# Patient Record
Sex: Female | Born: 1995 | Race: Black or African American | Hispanic: No | Marital: Single | Smoking: Never smoker
Health system: Southern US, Community
[De-identification: ages and names within clinical notes are randomized; demographics above are authoritative.]

---

## 2018-01-08 ENCOUNTER — Ambulatory Visit (HOSPITAL_COMMUNITY)
Admission: EM | Admit: 2018-01-08 | Discharge: 2018-01-08 | Discharge status: Against Medical Advice | Payer: Medicaid Other

## 2018-01-08 NOTE — ED Notes (Signed)
Per registration, pt stated she needed to leave and will return later.

## 2018-03-30 ENCOUNTER — Ambulatory Visit (HOSPITAL_COMMUNITY)
Admission: EM | Admit: 2018-03-30 | Discharge: 2018-03-30 | Disposition: A | Discharge status: Home / Self Care | Payer: Medicaid Other | Attending: Family Medicine | Admitting: Family Medicine

## 2018-03-30 ENCOUNTER — Encounter (HOSPITAL_COMMUNITY): Payer: Self-pay | Admitting: Emergency Medicine

## 2018-03-30 DIAGNOSIS — J028 Acute pharyngitis due to other specified organisms: Secondary | ICD-10-CM

## 2018-03-30 DIAGNOSIS — J029 Acute pharyngitis, unspecified: Secondary | ICD-10-CM | POA: Insufficient documentation

## 2018-03-30 DIAGNOSIS — J039 Acute tonsillitis, unspecified: Secondary | ICD-10-CM

## 2018-03-30 LAB — POCT RAPID STREP A: Streptococcus, Group A Screen (Direct): NEGATIVE

## 2018-03-30 MED ORDER — AMOXICILLIN 500 MG PO CAPS: 500.0000 mg | ORAL_CAPSULE | Freq: Two times a day (BID) | ORAL | 0 refills | Status: AC

## 2018-03-30 NOTE — ED Triage Notes (Signed)
Pt sts sore throat x 1 week 

## 2018-03-30 NOTE — ED Provider Notes (Addendum)
[facility]    CSN: [id number] Arrival date & time: 03/30/18  1232     History   Chief Complaint Chief Complaint  Patient presents with  . Sore Throat    HPI Werner LeanJazmon Gama is a 22 y.o. female.complains of sore throat for the past week.  She denies any sick contacts. She states the pain is constant and burning. She has tried aleve with improvement.  She reports worsening of symptoms with swallowing.  She has not had similar symptoms in the past.  Patient states she has had strep throat in the past, but this does not feel the same.  She also complains of rhinorrhea.   She denies fever, chills, fatigue, ear pain, congestion, sinus pain, sinus pressure, cough, chest congestion, SOB, chest pain, abdominal pain, nausea, changes in bowel or bladder habits.    HPI  History reviewed. No pertinent past medical history.  There are no active problems to display for this patient.   History reviewed. No pertinent surgical history.  OB History   None      Home Medications    Prior to Admission medications   Not on File    Family History History reviewed. No pertinent family history.  Social History Social History   Tobacco Use  . Smoking status: Never Smoker  . Smokeless tobacco: Never Used  Substance Use Topics  . Alcohol use: Never    Frequency: Never  . Drug use: Never     Allergies   Patient has no known allergies.   Review of Systems Review of Systems  Constitutional: Negative for chills, fatigue and fever.  HENT: Positive for rhinorrhea and sore throat. Negative for congestion, ear discharge, ear pain, sinus pressure and sinus pain.   Respiratory: Negative for cough and shortness of breath.   Cardiovascular: Negative for chest pain.  Gastrointestinal: Negative.   Genitourinary: Negative.     See HPI Physical Exam Triage Vital Signs ED Triage Vitals [03/30/18 1250]  Enc Vitals Group     BP (!) 140/99     Pulse Rate (!) 113     Resp 18   Temp 98.7 F (37.1 C)     Temp Source Oral     SpO2 98 %     Weight      Height      Head Circumference      Peak Flow      Pain Score      Pain Loc      Pain Edu?      Excl. in GC?    No data found.  Updated Vital Signs BP (!) 140/99 (BP Location: Left Arm)   Pulse (!) 113   Temp 98.7 F (37.1 C) (Oral)   Resp 18   SpO2 98%      Pulse rechecked during examination and was 80 bpm  Physical Exam  Constitutional: She is oriented to person, place, and time. She appears well-developed and well-nourished.  HENT:  Head: Normocephalic and atraumatic.  Right Ear: Tympanic membrane and ear canal normal. No tenderness. No middle ear effusion.  Left Ear: Tympanic membrane and ear canal normal. No tenderness.  No middle ear effusion.  Mouth/Throat: Uvula is midline. Mucous membranes are not pale and not dry. No oral lesions. No uvula swelling. Oropharyngeal exudate, posterior oropharyngeal edema and posterior oropharyngeal erythema present. No tonsillar abscesses. Tonsils are 2+ on the right. Tonsils are 2+ on the left. Tonsillar exudate.  Eyes: Pupils are equal, round, and reactive to  light. EOM are normal.  Anterior and posterior cervical lymphadenopathy  Neck: Normal range of motion. Neck supple.  Anterior and posterior cervical lymphadenopathy  Cardiovascular: Normal rate, regular rhythm and normal heart sounds. Exam reveals no gallop.  No murmur heard. Pulmonary/Chest: Effort normal and breath sounds normal. No respiratory distress. She has no wheezes. She has no rhonchi. She has no rales.  Lymphadenopathy:    She has cervical adenopathy.  Neurological: She is alert and oriented to person, place, and time.  Skin: Skin is warm and dry.  Psychiatric: Her behavior is normal.  Tearful in beginning of exam due to financial concerns     UC Treatments / Results  Labs (all labs ordered are listed, but only abnormal results are displayed) Labs Reviewed  CULTURE, GROUP A STREP  [facility])  POCT RAPID STREP A    EKG None Radiology No results found.  Procedures Procedures (including critical care time)  Medications Ordered in UC Medications - No data to display   Initial Impression / Assessment and Plan / UC Course  I have reviewed the triage vital signs and the nursing notes.  Pertinent labs & imaging results that were available during my care of the patient were reviewed by me and considered in my medical decision making (see chart for details).  Clinical Course as of Apr 07 2122  Wed Mar 30, 2018  1340 Rechecked pulse: 80 bpm and regular during exam  Pulse Rate(!): 113 [BW]    Clinical Course User Index [BW] Saphronia Ozdemir, [place], PA-C    Rapid strep negative, culture sent. Symptoms and exam findings most consistent with strep throat.  Start antibiotic as directed. Symptomatic treatment as needed. Return precautions given.    Final Clinical Impressions(s) / UC Diagnoses   Final diagnoses:  None    ED Discharge Orders    None       Controlled Substance Prescriptions [place] Controlled Substance Registry consulted? Not Applicable   Rennis Harding, PA-C 03/30/18 1335    Alvino Chapel [place], [place] 04/07/18 2124

## 2018-03-30 NOTE — Discharge Instructions (Addendum)
Get plenty of rest and push fluids Take antibiotic as directed and to completion Use OTC medication as needed for symptomatic relief Follow up with PCP if symptoms persist Return or go to ER if you have any new or worsening symptoms

## 2018-04-01 LAB — CULTURE, GROUP A STREP (THRC)

## 2018-04-02 ENCOUNTER — Encounter (HOSPITAL_COMMUNITY): Payer: Self-pay | Admitting: Emergency Medicine

## 2018-04-02 ENCOUNTER — Ambulatory Visit (HOSPITAL_COMMUNITY)
Admission: EM | Admit: 2018-04-02 | Discharge: 2018-04-02 | Disposition: A | Discharge status: Home / Self Care | Payer: Self-pay | Attending: Family Medicine | Admitting: Family Medicine

## 2018-04-02 DIAGNOSIS — B2790 Infectious mononucleosis, unspecified without complication: Secondary | ICD-10-CM

## 2018-04-02 LAB — POCT INFECTIOUS MONO SCREEN: MONO SCREEN: POSITIVE — AB

## 2018-04-02 MED ORDER — METHYLPREDNISOLONE SODIUM SUCC 125 MG IJ SOLR
125.0000 mg | Freq: Once | INTRAMUSCULAR | Status: AC
  Administered 2018-04-02: 125 mg via INTRAMUSCULAR

## 2018-04-02 MED ORDER — METHYLPREDNISOLONE SODIUM SUCC 125 MG IJ SOLR
INTRAMUSCULAR | Status: AC
  Filled 2018-04-02: qty 2

## 2018-04-02 MED ORDER — IBUPROFEN 800 MG PO TABS: 800.0000 mg | ORAL_TABLET | Freq: Three times a day (TID) | ORAL | 0 refills | Status: AC

## 2018-04-02 MED ORDER — PREDNISONE 50 MG PO TABS: 50.0000 mg | ORAL_TABLET | Freq: Every day | ORAL | 0 refills | Status: AC

## 2018-04-02 NOTE — ED Triage Notes (Signed)
Pt seen here wed for sore throat, given amoxicillin, pt states throat feels worse.

## 2018-04-02 NOTE — Discharge Instructions (Signed)
Test was positive for mono, this is a virus that does not need antibiotics; the main treatment is controlling pain, please take Tylenol or ibuprofen for further pain.  We gave you a shot of Solu-Medrol today, please take prednisone 50 mg daily for the next 4 days starting tomorrow.  Please avoid physical activity, drink plenty of fluids.

## 2018-04-03 NOTE — ED Provider Notes (Signed)
[facility]    CSN: [id number] Arrival date & time: 04/02/18  1253     History   Chief Complaint Chief Complaint  Patient presents with  . Sore Throat    HPI Melody Walker is a 22 y.o. female presenting today for evaluation of sore throat.  Patient was here approximately 3 days ago and was empirically started on amoxicillin despite strep testing negative.  She returns today as she has had worsening sore throat and difficulty swallowing despite antibiotic use.  It has become difficult for her even to take the amoxicillin.  She denies fevers.  Voice has become hoarse.  Denies associated congestion or cough.  HPI  History reviewed. No pertinent past medical history.  There are no active problems to display for this patient.   History reviewed. No pertinent surgical history.  OB History   None      Home Medications    Prior to Admission medications   Medication Sig Start Date End Date Taking? Authorizing Provider  amoxicillin (AMOXIL) 500 MG capsule Take 1 capsule (500 mg total) by mouth 2 (two) times daily for 10 days. 03/30/18 04/09/18  Wurst, Lowanda Foster, PA-C  ibuprofen (ADVIL,MOTRIN) 800 MG tablet Take 1 tablet (800 mg total) by mouth 3 (three) times daily. 04/02/18   Wieters, Hallie C, PA-C  predniSONE (DELTASONE) 50 MG tablet Take 1 tablet (50 mg total) by mouth daily for 5 days. 04/02/18 04/07/18  Wieters, Junius Creamer, PA-C    Family History No family history on file.  Social History Social History   Tobacco Use  . Smoking status: Never Smoker  . Smokeless tobacco: Never Used  Substance Use Topics  . Alcohol use: Never    Frequency: Never  . Drug use: Never     Allergies   Patient has no known allergies.   Review of Systems Review of Systems  Constitutional: Negative for chills, fatigue and fever.  HENT: Positive for sore throat, trouble swallowing and voice change. Negative for congestion, ear pain, rhinorrhea and sinus pressure.   Respiratory:  Negative for cough, chest tightness and shortness of breath.   Cardiovascular: Negative for chest pain.  Gastrointestinal: Negative for abdominal pain, nausea and vomiting.  Musculoskeletal: Negative for myalgias.  Skin: Negative for rash.  Neurological: Negative for dizziness, light-headedness and headaches.     Physical Exam Triage Vital Signs ED Triage Vitals [04/02/18 1403]  Enc Vitals Group     BP (!) 144/82     Pulse Rate (!) 112     Resp 18     Temp 99 F (37.2 C)     Temp src      SpO2 100 %     Weight      Height      Head Circumference      Peak Flow      Pain Score      Pain Loc      Pain Edu?      Excl. in GC?    No data found.  Updated Vital Signs BP (!) 144/82   Pulse (!) 112   Temp 99 F (37.2 C)   Resp 18   SpO2 100%   Visual Acuity Right Eye Distance:   Left Eye Distance:   Bilateral Distance:    Right Eye Near:   Left Eye Near:    Bilateral Near:     Physical Exam  Constitutional: She appears well-developed and well-nourished. No distress.  HENT:  Head: Normocephalic and atraumatic.  Significantly enlarged tonsils bilaterally, tonsils are touching each other with erythema and a thick white layer of exudate on tonsils, significant tonsillar lymphadenopathy on right side of neck.  No uvula deviation or swelling.  Maintaining airway.  Voice is slightly hoarse/muffled.  Eyes: Conjunctivae are normal.  Neck: Neck supple.  Cardiovascular: Normal rate and regular rhythm.  No murmur heard. Pulmonary/Chest: Effort normal and breath sounds normal. No respiratory distress.  Breathing comfortably at rest, CTA BL  Abdominal: Soft. There is no tenderness.  Musculoskeletal: She exhibits no edema.  Neurological: She is alert.  Skin: Skin is warm and dry.  Psychiatric: She has a normal mood and affect.  Nursing note and vitals reviewed.    UC Treatments / Results  Labs (all labs ordered are listed, but only abnormal results are displayed) Labs  Reviewed  POCT INFECTIOUS MONO SCREEN - Abnormal; Notable for the following components:      Result Value   Mono Screen POSITIVE (*)    All other components within normal limits    EKG None Radiology No results found.  Procedures Procedures (including critical care time)  Medications Ordered in UC Medications  methylPREDNISolone sodium succinate (SOLU-MEDROL) 125 mg/2 mL injection 125 mg (125 mg Intramuscular Given 04/02/18 1508)     Initial Impression / Assessment and Plan / UC Course  I have reviewed the triage vital signs and the nursing notes.  Pertinent labs & imaging results that were available during my care of the patient were reviewed by me and considered in my medical decision making (see chart for details).     Patient with sore throat that is not improving with antibiotic therapy.  Monospot positive.  Initially called ENT on call given significant worsening and swelling of the throat to discuss possible abscess.  Given the Monospot was positive will provide steroids to help reduce some of the inflammation in the tonsils to help with swallowing and breathing.  Advised patient to go to emergency room if she has any worsening of swelling or difficulty breathing.Information on mono provided. Discussed strict return precautions. Patient verbalized understanding and is agreeable with plan.   Final Clinical Impressions(s) / UC Diagnoses   Final diagnoses:  Infectious mononucleosis without complication, infectious mononucleosis due to unspecified organism    ED Discharge Orders        Ordered    predniSONE (DELTASONE) 50 MG tablet  Daily     04/02/18 1451    ibuprofen (ADVIL,MOTRIN) 800 MG tablet  3 times daily     04/02/18 1451       Controlled Substance Prescriptions [place] Controlled Substance Registry consulted? Not Applicable   Lew DawesWieters, Hallie C, [place]-C 04/03/18 [id number]

## 2021-07-24 ENCOUNTER — Emergency Department (HOSPITAL_BASED_OUTPATIENT_CLINIC_OR_DEPARTMENT_OTHER): Payer: Managed Care, Other (non HMO)

## 2021-07-24 ENCOUNTER — Encounter (HOSPITAL_BASED_OUTPATIENT_CLINIC_OR_DEPARTMENT_OTHER): Payer: Self-pay

## 2021-07-24 ENCOUNTER — Emergency Department (HOSPITAL_BASED_OUTPATIENT_CLINIC_OR_DEPARTMENT_OTHER)
Admission: EM | Admit: 2021-07-24 | Discharge: 2021-07-25 | Disposition: A | Discharge status: Home / Self Care | Payer: Managed Care, Other (non HMO) | Attending: Emergency Medicine | Admitting: Emergency Medicine

## 2021-07-24 ENCOUNTER — Other Ambulatory Visit: Payer: Self-pay

## 2021-07-24 DIAGNOSIS — Y92410 Unspecified street and highway as the place of occurrence of the external cause: Secondary | ICD-10-CM | POA: Diagnosis not present

## 2021-07-24 DIAGNOSIS — S92511A Displaced fracture of proximal phalanx of right lesser toe(s), initial encounter for closed fracture: Secondary | ICD-10-CM | POA: Insufficient documentation

## 2021-07-24 DIAGNOSIS — S99921A Unspecified injury of right foot, initial encounter: Secondary | ICD-10-CM | POA: Diagnosis present

## 2021-07-24 MED ORDER — HYDROCODONE-ACETAMINOPHEN 5-325 MG PO TABS: 1.0000 | ORAL_TABLET | Freq: Four times a day (QID) | ORAL | 0 refills | Status: AC | PRN

## 2021-07-24 MED ORDER — IBUPROFEN 400 MG PO TABS
400.0000 mg | ORAL_TABLET | Freq: Once | ORAL | Status: AC
  Administered 2021-07-24: 400 mg via ORAL
  Filled 2021-07-24: qty 1

## 2021-07-24 MED ORDER — HYDROCODONE-ACETAMINOPHEN 5-325 MG PO TABS
1.0000 | ORAL_TABLET | Freq: Once | ORAL | Status: AC
  Administered 2021-07-24: 1 via ORAL
  Filled 2021-07-24: qty 1
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
## 2021-07-31 ENCOUNTER — Other Ambulatory Visit: Payer: Self-pay

## 2021-07-31 ENCOUNTER — Encounter: Payer: Self-pay | Admitting: Family Medicine

## 2021-07-31 ENCOUNTER — Ambulatory Visit (INDEPENDENT_AMBULATORY_CARE_PROVIDER_SITE_OTHER): Payer: Self-pay | Admitting: Family Medicine

## 2021-07-31 ENCOUNTER — Ambulatory Visit: Payer: Managed Care, Other (non HMO) | Admitting: Family Medicine

## 2021-07-31 ENCOUNTER — Ambulatory Visit (HOSPITAL_BASED_OUTPATIENT_CLINIC_OR_DEPARTMENT_OTHER)
Admission: RE | Admit: 2021-07-31 | Discharge: 2021-07-31 | Disposition: A | Discharge status: Home / Self Care | Payer: Managed Care, Other (non HMO) | Source: Ambulatory Visit | Attending: Family Medicine | Admitting: Family Medicine

## 2021-07-31 VITALS — BP 150/82 | Ht 61.0 in | Wt 180.0 lb

## 2021-07-31 DIAGNOSIS — S92511A Displaced fracture of proximal phalanx of right lesser toe(s), initial encounter for closed fracture: Secondary | ICD-10-CM | POA: Insufficient documentation

## 2021-07-31 NOTE — Assessment & Plan Note (Signed)
Initial injury on 8/11.  Having mildly displaced intra-articular fracture of the toe. -Counseled on home exercise therapy and supportive care. -X-ray today. -Provided work note. -Postop shoe. -Counseled on buddy taping.
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
## 2021-07-31 NOTE — Patient Instructions (Signed)
Nice to meet you Please use the post op shoe  Please buddy tape  Please use ice   Please send me a message in MyChart with any questions or updates.  Please see me back on 8/31.  .   --Dr. Jordan Likes

## 2021-08-04 ENCOUNTER — Telehealth: Payer: Self-pay | Admitting: Family Medicine

## 2021-08-04 NOTE — Telephone Encounter (Signed)
Left VM for patient. If she calls back please have her speak with a nurse/CMA and inform that her xray is stable and we'll follow up as planned.   If any questions then please take the best time and phone number to call and I will try to call her back.   Myra Rude, MD [facility] 08/04/2021, 8:16 AM

## 2021-08-13 ENCOUNTER — Encounter: Payer: Self-pay | Admitting: Family Medicine

## 2021-08-13 ENCOUNTER — Other Ambulatory Visit: Payer: Self-pay

## 2021-08-13 ENCOUNTER — Ambulatory Visit (INDEPENDENT_AMBULATORY_CARE_PROVIDER_SITE_OTHER): Payer: Managed Care, Other (non HMO) | Admitting: Family Medicine

## 2021-08-13 ENCOUNTER — Ambulatory Visit (HOSPITAL_BASED_OUTPATIENT_CLINIC_OR_DEPARTMENT_OTHER)
Admission: RE | Admit: 2021-08-13 | Discharge: 2021-08-13 | Disposition: A | Discharge status: Home / Self Care | Payer: Managed Care, Other (non HMO) | Source: Ambulatory Visit | Attending: Family Medicine | Admitting: Family Medicine

## 2021-08-13 DIAGNOSIS — S92511D Displaced fracture of proximal phalanx of right lesser toe(s), subsequent encounter for fracture with routine healing: Secondary | ICD-10-CM | POA: Diagnosis present

## 2021-08-13 NOTE — Progress Notes (Signed)
  Melody Walker - 25 y.o. female MRN [id number]  Date of birth: [birth year]  SUBJECTIVE:  Including CC & ROS.  No chief complaint on file.   Melody Walker is a 25 y.o. female that is following up for her toe fracture.  She is still having pain.  Has been using a postop shoe buddy taping.    Review of Systems See HPI   HISTORY: Past Medical, Surgical, Social, and Family History Reviewed & Updated per EMR.   Pertinent Historical Findings include:  History reviewed. No pertinent past medical history.  History reviewed. No pertinent surgical history.  History reviewed. No pertinent family history.  Social History   Socioeconomic History   Marital status: Single    Spouse name: Not on file   Number of children: Not on file   Years of education: Not on file   Highest education level: Not on file  Occupational History   Not on file  Tobacco Use   Smoking status: Never   Smokeless tobacco: Never  Substance and Sexual Activity   Alcohol use: Yes    Comment: occ   Drug use: Never   Sexual activity: Not on file  Other Topics Concern   Not on file  Social History Narrative   Not on file   Social Determinants of Health   Financial Resource Strain: Not on file  Food Insecurity: Not on file  Transportation Needs: Not on file  Physical Activity: Not on file  Stress: Not on file  Social Connections: Not on file  Intimate Partner Violence: Not on file     PHYSICAL EXAM:  VS: Ht 5\' 1"  (1.549 m)   Wt 180 lb (81.6 kg)   LMP 08/04/2021 (Approximate)   BMI 34.01 kg/m  Physical Exam Gen: NAD, alert, cooperative with exam, well-appearing       ASSESSMENT & PLAN:   Closed displaced fracture of proximal phalanx of lesser toe of right foot Initial injury 8/11.  Still having pain at the MTP joint but swelling and ecchymosis have improved. -Counseled on home exercise therapy and supportive care. -Add additional padding under the toe. -Counseled on postop shoe. -Provided work  note. -X-ray. -Follow-up in 2 weeks.

## 2021-08-13 NOTE — Patient Instructions (Signed)
Good to see you  Please use ice as needed  I will call with the results.  Please send me a message in MyChart with any questions or updates.  Please see me back in 2 weeks.   --Dr. Jordan Likes

## 2021-08-13 NOTE — Assessment & Plan Note (Addendum)
Initial injury 8/11.  Still having pain at the MTP joint but swelling and ecchymosis have improved. -Counseled on home exercise therapy and supportive care. -Add additional padding under the toe. -Counseled on postop shoe. -Provided work note. -X-ray. -Follow-up in 2 weeks.

## 2021-08-14 ENCOUNTER — Telehealth: Payer: Self-pay | Admitting: Family Medicine

## 2021-08-14 NOTE — Telephone Encounter (Signed)
Patient called states she gave Work note to [employer] but was told they could NOT accommodate restricted duty & advised pt she would need to file FMLA -- Employer is faxing over FMLA forms for Dr. Jordan Likes to complete & return.  --Forwarding message to provider,forms rcvd & place on desk.  --glh

## 2021-08-19 ENCOUNTER — Telehealth: Payer: Self-pay | Admitting: Family Medicine

## 2021-08-19 NOTE — Telephone Encounter (Signed)
Informed of results.   Myra Rude, MD [facility] 08/19/2021, 8:09 AM

## 2021-08-19 NOTE — Telephone Encounter (Signed)
Completed paperwork.   Myra Rude, MD [facility] 08/19/2021, 12:41 PM

## 2021-08-27 ENCOUNTER — Other Ambulatory Visit: Payer: Self-pay

## 2021-08-27 ENCOUNTER — Ambulatory Visit (INDEPENDENT_AMBULATORY_CARE_PROVIDER_SITE_OTHER): Payer: Managed Care, Other (non HMO) | Admitting: Family Medicine

## 2021-08-27 ENCOUNTER — Encounter: Payer: Self-pay | Admitting: Family Medicine

## 2021-08-27 DIAGNOSIS — S92511D Displaced fracture of proximal phalanx of right lesser toe(s), subsequent encounter for fracture with routine healing: Secondary | ICD-10-CM | POA: Diagnosis not present

## 2021-08-27 NOTE — Progress Notes (Signed)
  Melody Walker - 25 y.o. female MRN [id number]  Date of birth: 08/08/1996  SUBJECTIVE:  Including CC & ROS.  No chief complaint on file.   Melody Walker is a 25 y.o. female that is following up for her fractured tibia.  She denies any pain today and the cushioning and postop shoe has helped.   Review of Systems See HPI   HISTORY: Past Medical, Surgical, Social, and Family History Reviewed & Updated per EMR.   Pertinent Historical Findings include:  History reviewed. No pertinent past medical history.  History reviewed. No pertinent surgical history.  History reviewed. No pertinent family history.  Social History   Socioeconomic History   Marital status: Single    Spouse name: Not on file   Number of children: Not on file   Years of education: Not on file   Highest education level: Not on file  Occupational History   Not on file  Tobacco Use   Smoking status: Never   Smokeless tobacco: Never  Substance and Sexual Activity   Alcohol use: Yes    Comment: occ   Drug use: Never   Sexual activity: Not on file  Other Topics Concern   Not on file  Social History Narrative   Not on file   Social Determinants of Health   Financial Resource Strain: Not on file  Food Insecurity: Not on file  Transportation Needs: Not on file  Physical Activity: Not on file  Stress: Not on file  Social Connections: Not on file  Intimate Partner Violence: Not on file     PHYSICAL EXAM:  VS: Ht 5\' 1"  (1.549 m)   Wt 180 lb (81.6 kg)   LMP 08/04/2021 (Approximate)   BMI 34.01 kg/m  Physical Exam Gen: NAD, alert, cooperative with exam, well-appearing      ASSESSMENT & PLAN:   Closed displaced fracture of proximal phalanx of lesser toe of right foot Initial injury on 8/11.  No pain today. -Counseled on home exercise therapy and supportive care. -Counseled on cushioning. -Can follow-up as needed.

## 2021-08-27 NOTE — Patient Instructions (Signed)
Good to see you Please use ice or tylenol as needed  You can use the cushion with your shoes   Please send me a message in MyChart with any questions or updates.  Please see me back as needed.   --Dr. Jordan Likes

## 2021-08-27 NOTE — Assessment & Plan Note (Signed)
Initial injury on 8/11.  No pain today. -Counseled on home exercise therapy and supportive care. -Counseled on cushioning. -Can follow-up as needed.
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
## 2023-03-29 ENCOUNTER — Encounter: Payer: Self-pay | Admitting: *Deleted

## 2024-01-15 ENCOUNTER — Ambulatory Visit: Payer: Managed Care, Other (non HMO)
